# Patient Record
Sex: Female | Born: 1995 | Race: Black or African American | Hispanic: No | Marital: Single | Smoking: Never smoker
Health system: Southern US, Community
[De-identification: ages and names within clinical notes are randomized; demographics above are authoritative.]

---
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
## 2014-02-03 ENCOUNTER — Emergency Department (HOSPITAL_COMMUNITY)
Admission: EM | Admit: 2014-02-03 | Discharge: 2014-02-03 | Disposition: A | Discharge status: Home / Self Care | Payer: Medicaid - Out of State | Attending: Emergency Medicine | Admitting: Emergency Medicine

## 2014-02-03 ENCOUNTER — Encounter (HOSPITAL_COMMUNITY): Payer: Self-pay | Admitting: Emergency Medicine

## 2014-02-03 DIAGNOSIS — J069 Acute upper respiratory infection, unspecified: Secondary | ICD-10-CM | POA: Diagnosis not present

## 2014-02-03 DIAGNOSIS — R05 Cough: Secondary | ICD-10-CM | POA: Diagnosis present

## 2014-02-03 DIAGNOSIS — Z791 Long term (current) use of non-steroidal anti-inflammatories (NSAID): Secondary | ICD-10-CM | POA: Insufficient documentation

## 2014-02-03 MED ORDER — IBUPROFEN 400 MG PO TABS
400.0000 mg | ORAL_TABLET | Freq: Once | ORAL | Status: AC
  Administered 2014-02-03: 400 mg via ORAL
  Filled 2014-02-03: qty 1

## 2014-02-03 NOTE — ED Notes (Signed)
Pt reports that Monday she started feeling bad with cough/congestion and today she developed a headache. Pt sts she took 5 500mg  ibuprofen since this afternoon (sts she is sure it was 500mg  ibuprofen) pt has since then she has become dizzy.

## 2014-02-03 NOTE — ED Notes (Signed)
Patient is alert and orientedx4.  Patient was explained discharge instructions and they understood them with no questions.  The patient's friend, Raynald BlendBrittany Bowles is taking the patient home.

## 2014-02-03 NOTE — Discharge Instructions (Signed)
It was our pleasure to provide your ER care today - we hope that you feel better.  Rest. Drink plenty of fluids.  Take tylenol/motrin as need.  You may try over the counter cold/flu medication for symptom relief.  Follow up with primary care doctor in 1 week if symptoms fail to improve/resolve.  Return to ER if worse, new symptoms, trouble breathing, other concern.     Upper Respiratory Infection, Adult An upper respiratory infection (URI) is also sometimes known as the common cold. The upper respiratory tract includes the nose, sinuses, throat, trachea, and bronchi. Bronchi are the airways leading to the lungs. Most people improve within 1 week, but symptoms can last up to 2 weeks. A residual cough may last even longer.  CAUSES Many different viruses can infect the tissues lining the upper respiratory tract. The tissues become irritated and inflamed and often become very moist. Mucus production is also common. A cold is contagious. You can easily spread the virus to others by oral contact. This includes kissing, sharing a glass, coughing, or sneezing. Touching your mouth or nose and then touching a surface, which is then touched by another person, can also spread the virus. SYMPTOMS  Symptoms typically develop 1 to 3 days after you come in contact with a cold virus. Symptoms vary from person to person. They may include:  Runny nose.  Sneezing.  Nasal congestion.  Sinus irritation.  Sore throat.  Loss of voice (laryngitis).  Cough.  Fatigue.  Muscle aches.  Loss of appetite.  Headache.  Low-grade fever. DIAGNOSIS  You might diagnose your own cold based on familiar symptoms, since most people get a cold 2 to 3 times a year. Your caregiver can confirm this based on your exam. Most importantly, your caregiver can check that your symptoms are not due to another disease such as strep throat, sinusitis, pneumonia, asthma, or epiglottitis. Blood tests, throat tests, and X-rays  are not necessary to diagnose a common cold, but they may sometimes be helpful in excluding other more serious diseases. Your caregiver will decide if any further tests are required. RISKS AND COMPLICATIONS  You may be at risk for a more severe case of the common cold if you smoke cigarettes, have chronic heart disease (such as heart failure) or lung disease (such as asthma), or if you have a weakened immune system. The very young and very old are also at risk for more serious infections. Bacterial sinusitis, middle ear infections, and bacterial pneumonia can complicate the common cold. The common cold can worsen asthma and chronic obstructive pulmonary disease (COPD). Sometimes, these complications can require emergency medical care and may be life-threatening. PREVENTION  The best way to protect against getting a cold is to practice good hygiene. Avoid oral or hand contact with people with cold symptoms. Wash your hands often if contact occurs. There is no clear evidence that vitamin C, vitamin E, echinacea, or exercise reduces the chance of developing a cold. However, it is always recommended to get plenty of rest and practice good nutrition. TREATMENT  Treatment is directed at relieving symptoms. There is no cure. Antibiotics are not effective, because the infection is caused by a virus, not by bacteria. Treatment may include:  Increased fluid intake. Sports drinks offer valuable electrolytes, sugars, and fluids.  Breathing heated mist or steam (vaporizer or shower).  Eating chicken soup or other clear broths, and maintaining good nutrition.  Getting plenty of rest.  Using gargles or lozenges for comfort.  Controlling  fevers with ibuprofen or acetaminophen as directed by your caregiver.  Increasing usage of your inhaler if you have asthma. Zinc gel and zinc lozenges, taken in the first 24 hours of the common cold, can shorten the duration and lessen the severity of symptoms. Pain medicines  may help with fever, muscle aches, and throat pain. A variety of non-prescription medicines are available to treat congestion and runny nose. Your caregiver can make recommendations and may suggest nasal or lung inhalers for other symptoms.  HOME CARE INSTRUCTIONS   Only take over-the-counter or prescription medicines for pain, discomfort, or fever as directed by your caregiver.  Use a warm mist humidifier or inhale steam from a shower to increase air moisture. This may keep secretions moist and make it easier to breathe.  Drink enough water and fluids to keep your urine clear or pale yellow.  Rest as needed.  Return to work when your temperature has returned to normal or as your caregiver advises. You may need to stay home longer to avoid infecting others. You can also use a face mask and careful hand washing to prevent spread of the virus. SEEK MEDICAL CARE IF:   After the first few days, you feel you are getting worse rather than better.  You need your caregiver's advice about medicines to control symptoms.  You develop chills, worsening shortness of breath, or Mergen or red sputum. These may be signs of pneumonia.  You develop yellow or Delorey nasal discharge or pain in the face, especially when you bend forward. These may be signs of sinusitis.  You develop a fever, swollen neck glands, pain with swallowing, or white areas in the back of your throat. These may be signs of strep throat. SEEK IMMEDIATE MEDICAL CARE IF:   You have a fever.  You develop severe or persistent headache, ear pain, sinus pain, or chest pain.  You develop wheezing, a prolonged cough, cough up blood, or have a change in your usual mucus (if you have chronic lung disease).  You develop sore muscles or a stiff neck. Document Released: 06/13/2000 Document Revised: 03/12/2011 Document Reviewed: 03/25/2013 [facility] Patient Information 2015 [place], [place]. This information is not intended to replace advice given  to you by your health care provider. Make sure you discuss any questions you have with your health care provider.

## 2014-02-03 NOTE — ED Provider Notes (Signed)
CSN: [id number]     Arrival date & time 02/03/14  2148 History   First MD Initiated Contact with Patient 02/03/14 2225     Chief Complaint  Patient presents with  . Cough  . Headache     (Consider location/radiation/quality/duration/timing/severity/associated sxs/prior Treatment) Patient is a 19 y.o. female presenting with cough and headaches. The history is provided by the patient.  Cough Associated symptoms: headaches   Associated symptoms: no chest pain, no chills, no eye discharge, no rash and no shortness of breath   Headache Associated symptoms: cough   Associated symptoms: no abdominal pain, no back pain, no pain, no neck pain, no neck stiffness, no numbness and no sinus pressure   pt c/o non productive cough in the past few days. Scratchy throat, no trouble swallowing. Had gradual onset headache earlier, mild, now improved. No neck pain or stiffness. subj fever. No chills/sweats. Denies abd pain. No nvd. No dysuria or gu c/o. No cp or sob. No known ill contacts. Normally healthy, no hx chronic illness, non smoker.     History reviewed. No pertinent past medical history. History reviewed. No pertinent past surgical history. No family history on file. History  Substance Use Topics  . Smoking status: Never Smoker   . Smokeless tobacco: Not on file  . Alcohol Use: No   OB History    No data available     Review of Systems  Constitutional: Negative for chills.  HENT: Negative for sinus pressure and trouble swallowing.   Eyes: Negative for pain, discharge and redness.  Respiratory: Positive for cough. Negative for shortness of breath.   Cardiovascular: Negative for chest pain.  Gastrointestinal: Negative for abdominal pain.  Genitourinary: Negative for flank pain.  Musculoskeletal: Negative for back pain, neck pain and neck stiffness.  Skin: Negative for rash.  Neurological: Positive for headaches. Negative for weakness and numbness.      Allergies  Review of  patient's allergies indicates no known allergies.  Home Medications   Prior to Admission medications   Medication Sig Start Date End Date Taking? Authorizing Provider  ibuprofen (ADVIL,MOTRIN) 200 MG tablet Take 1,000 mg by mouth every 6 (six) hours as needed. Patient stated that she took five "500mg " Ibuprofen.   Yes Historical Provider, MD  medroxyPROGESTERone (DEPO-PROVERA) 150 MG/ML injection Inject 150 mg into the muscle every 3 (three) months.   Yes Historical Provider, MD  ibuprofen (ADVIL,MOTRIN) 800 MG tablet Take 1 tablet (800 mg total) by mouth 3 (three) times daily. 10/21/13   Courtney A Forcucci, PA-C   BP 121/72 mmHg  Pulse 78  Temp(Src) 98.1 F (36.7 C)  Resp 20  SpO2 100%  LMP 02/01/2014 (Approximate) Physical Exam  Constitutional: She appears well-developed and well-nourished. No distress.  HENT:  Nose: Nose normal.  Mouth/Throat: Oropharynx is clear and moist.  No sinus or temporal tenderness.   Eyes: Conjunctivae are normal. Pupils are equal, round, and reactive to light. No scleral icterus.  Neck: Neck supple. No tracheal deviation present.  No stiffness or rigidity  Cardiovascular: Normal rate, regular rhythm, normal heart sounds and intact distal pulses.  Exam reveals no gallop and no friction rub.   No murmur heard. Pulmonary/Chest: Effort normal and breath sounds normal. No respiratory distress.  Abdominal: Soft. Normal appearance and bowel sounds are normal. She exhibits no distension. There is no tenderness.  No hsm.   Genitourinary:  No cva tenderness  Musculoskeletal: She exhibits no edema or tenderness.  Lymphadenopathy:    She has  no cervical adenopathy.  Neurological: She is alert.  Skin: Skin is warm and dry. No rash noted.  Psychiatric: She has a normal mood and affect.  Nursing note and vitals reviewed.   ED Course  Procedures (including critical care time) Labs Review     MDM   Staff had placed iv, 500 cc ns.  Motrin po. Pt  tolerating po fluids.  Hx/exam felt most c/w viral uri.  Pt appears stable for d/c.      Suzi Roots, MD 02/03/14 2256

## 2014-02-10 ENCOUNTER — Emergency Department (HOSPITAL_COMMUNITY)
Admission: EM | Admit: 2014-02-10 | Discharge: 2014-02-10 | Disposition: A | Discharge status: Home / Self Care | Payer: Medicaid - Out of State | Attending: Emergency Medicine | Admitting: Emergency Medicine

## 2014-02-10 ENCOUNTER — Encounter (HOSPITAL_COMMUNITY): Payer: Self-pay | Admitting: Emergency Medicine

## 2014-02-10 DIAGNOSIS — H5711 Ocular pain, right eye: Secondary | ICD-10-CM

## 2014-02-10 DIAGNOSIS — Z79899 Other long term (current) drug therapy: Secondary | ICD-10-CM | POA: Diagnosis not present

## 2014-02-10 DIAGNOSIS — R51 Headache: Secondary | ICD-10-CM | POA: Insufficient documentation

## 2014-02-10 DIAGNOSIS — H6691 Otitis media, unspecified, right ear: Secondary | ICD-10-CM | POA: Diagnosis not present

## 2014-02-10 DIAGNOSIS — H9201 Otalgia, right ear: Secondary | ICD-10-CM | POA: Diagnosis present

## 2014-02-10 DIAGNOSIS — R519 Headache, unspecified: Secondary | ICD-10-CM

## 2014-02-10 MED ORDER — IBUPROFEN 800 MG PO TABS
800.0000 mg | ORAL_TABLET | Freq: Once | ORAL | Status: AC
  Administered 2014-02-10: 800 mg via ORAL
  Filled 2014-02-10: qty 1

## 2014-02-10 MED ORDER — FLUORESCEIN SODIUM 1 MG OP STRP
1.0000 | ORAL_STRIP | Freq: Once | OPHTHALMIC | Status: AC
  Administered 2014-02-10: 1 via OPHTHALMIC
  Filled 2014-02-10: qty 1

## 2014-02-10 MED ORDER — TETRACAINE HCL 0.5 % OP SOLN
2.0000 [drp] | Freq: Once | OPHTHALMIC | Status: AC
  Administered 2014-02-10: 2 [drp] via OPHTHALMIC
  Filled 2014-02-10: qty 2

## 2014-02-10 MED ORDER — CIPROFLOXACIN-DEXAMETHASONE 0.3-0.1 % OT SUSP
4.0000 [drp] | Freq: Once | OTIC | Status: AC
  Administered 2014-02-10: 4 [drp] via OTIC
  Filled 2014-02-10: qty 7.5

## 2014-02-10 MED ORDER — AMOXICILLIN 500 MG PO CAPS: 500.0000 mg | ORAL_CAPSULE | Freq: Three times a day (TID) | ORAL | Status: DC

## 2014-02-10 NOTE — ED Notes (Signed)
Per EMS pt comes from [employer] c/o rigth eye and right ear pain that started last night.  Pt took Tylenol last night for pain but hasnt taking anything today.  Pt also has cough with yellow congestion. Pt went to clinic at school but told EMS they looked in her ears and didn't do anything to help her so she called EMS.

## 2014-02-10 NOTE — ED Provider Notes (Signed)
CSN: [id number]     Arrival date & time 02/10/14  1402 History  This chart was scribed for non-physician practitioner, Louann SjogrenVictoria L Ikeem Cleckler, PA-C, working with No att. providers found, by Lionel DecemberHatice Demirci, ED Scribe. This patient was seen in room WTR9/WTR9 and the patient's care was started at 2:56 PM.   First MD Initiated Contact with Patient 02/10/14 1437     Chief Complaint  Patient presents with  . Eye Pain  . Otalgia  . Cough     (Consider location/radiation/quality/duration/timing/severity/associated sxs/prior Treatment) Patient is a 19 y.o. female presenting with eye pain, ear pain, and cough. The history is provided by the patient. No language interpreter was used.  Eye Pain  Otalgia Associated symptoms: cough   Associated symptoms: no congestion and no fever   Cough Associated symptoms: ear pain   Associated symptoms: no chills, no eye discharge and no fever     HPI Comments: Yolanda Henry is a 19 y.o. female who presents to the Emergency Department complaining of 9/10 eye pain/ear pain as well as a cough (no phlegm) since 7 pm onset 1 day ago. Patient also complains of a reoccurring intermittent headache.  Not severe or sudden in onset. Not worse of life. Did develop gradually. Patient does not wear contacts and has not taken any medication to alleviate her pain besides ear drops which was prescribed to her in 2014.  Patient denies any trauma to her eye and denies any discharge. No visual changes, slurred speech, weakness. Patient denies any fever/chills.    History reviewed. No pertinent past medical history. History reviewed. No pertinent past surgical history. No family history on file. History  Substance Use Topics  . Smoking status: Never Smoker   . Smokeless tobacco: Not on file  . Alcohol Use: No   OB History    No data available     Review of Systems  Constitutional: Negative for fever and chills.  HENT: Positive for ear pain. Negative for congestion.   Eyes:  Positive for pain. Negative for discharge and itching.  Respiratory: Positive for cough.       Allergies  Review of patient's allergies indicates no known allergies.  Home Medications   Prior to Admission medications   Medication Sig Start Date End Date Taking? Authorizing Provider  acetaminophen (TYLENOL) 500 MG tablet Take 1,000 mg by mouth every 6 (six) hours as needed for mild pain.   Yes Historical Provider, MD  carbamide peroxide (DEBROX) 6.5 % otic solution Place 5 drops into both ears 2 (two) times daily.   Yes Historical Provider, MD  medroxyPROGESTERone (DEPO-PROVERA) 150 MG/ML injection Inject 150 mg into the muscle every 3 (three) months.   Yes Historical Provider, MD  amoxicillin (AMOXIL) 500 MG capsule Take 1 capsule (500 mg total) by mouth 3 (three) times daily. 02/10/14   Louann SjogrenVictoria L Chastity Noland, PA-C  ibuprofen (ADVIL,MOTRIN) 200 MG tablet Take 1,000 mg by mouth every 6 (six) hours as needed. Patient stated that she took five "500mg " Ibuprofen.    Historical Provider, MD  ibuprofen (ADVIL,MOTRIN) 800 MG tablet Take 1 tablet (800 mg total) by mouth 3 (three) times daily. Patient not taking: Reported on 02/10/2014 10/21/13   Toni Amendourtney A Forcucci, PA-C   BP 112/56 mmHg  Pulse 85  Temp(Src) 98.5 F (36.9 C) (Oral)  Resp 20  SpO2 100%  LMP 02/01/2014 (Approximate) Physical Exam  Constitutional: She appears well-developed and well-nourished. No distress.  HENT:  Head: Normocephalic and atraumatic.  Right Ear: There is  tenderness. No drainage. No mastoid tenderness. Tympanic membrane is erythematous and retracted.  Left Ear: Tympanic membrane and external ear normal. No mastoid tenderness.  Nose: Right sinus exhibits no maxillary sinus tenderness and no frontal sinus tenderness. Left sinus exhibits no maxillary sinus tenderness and no frontal sinus tenderness.  Mouth/Throat: Mucous membranes are normal. Posterior oropharyngeal erythema present. No oropharyngeal exudate or  posterior oropharyngeal edema.  No facial rash or vesicular lesions.  Eyes: Conjunctivae and EOM are normal. Pupils are equal, round, and reactive to light. Lids are everted and swept, no foreign bodies found. Right conjunctiva is not injected. Right conjunctiva has no hemorrhage. Left conjunctiva is not injected. Left conjunctiva has no hemorrhage. Pupils are equal.  Visual acuity  Bilateral Distance: 20/20 ; R Distance: 20/25 ; L Distance: 20/20 Right intraocular pressure 22 Left intraocular pressure 22 No dendritic lesions corneal abrasion or ulcers. Patient with clear tears but no other discharge from eyes No mastoid tenderness.  Neck: Normal range of motion. Neck supple.  Cardiovascular: Normal rate, regular rhythm and normal heart sounds.   Pulmonary/Chest: Effort normal and breath sounds normal. No respiratory distress. She has no wheezes. She has no rales.  Abdominal: Soft. Bowel sounds are normal. She exhibits no distension. There is no tenderness.  Lymphadenopathy:    She has cervical adenopathy.  Neurological: She is alert.  Speech is clear and goal oriented. Peripheral visual fields intact. Strength 5/5 in upper and lower extremities. Sensation intact. No pronator drift. Normal gait.   Skin: Skin is warm and dry. She is not diaphoretic.  Nursing note and vitals reviewed.   ED Course  Procedures (including critical care time) DIAGNOSTIC STUDIES: Oxygen Saturation is 100% on RA, normal by my interpretation.    COORDINATION OF CARE: 3:15 PM Discussed treatment plan with patient at beside, the patient agrees with the plan and has no further questions at this time.  Labs Review Labs Reviewed - No data to display  Imaging Review No results found.   EKG Interpretation None        MDM   Final diagnoses:  Eye pain, right  Acute right otitis media, recurrence not specified, unspecified otitis media type  Acute nonintractable headache, unspecified headache type    Patient presents with otalgia and exam consistent with acute otitis media. No concern for acute mastoiditis, meningitis.  No antibiotic use in the last month.  Patient discharged home with Amoxicillin.  Patient with bilateral eye pain better with tetracaine. Visual acuity and intraocular pressure within normal limits. Patient without corneal abrasion, ulcer, dendritic lesions. Eye pain likely related to her headache. Patient's headache is without neurological symptoms. Normal neuro exam. I doubt intracranial hemorrhage, subarachnoid, meningitis. Patient is afebrile, nontoxic, and in no acute distress. Patient is appropriate for outpatient management and is stable for discharge. I have also discussed reasons to return immediately to the ER.  Parent expresses understanding and agrees with plan.  Discussed return precautions with patient. Discussed all results and patient verbalizes understanding and agrees with plan.  I personally performed the services described in this documentation, which was scribed in my presence. The recorded information has been reviewed and is accurate.    Louann Sjogren, PA-C 02/10/14 2034  Richardean Canal, MD 02/11/14 [phone]

## 2014-02-10 NOTE — Discharge Instructions (Signed)
Return to the emergency room with worsening of symptoms, new symptoms or with symptoms that are concerning, , especially fevers, stiff neck, worsening headache, nausea/vomiting, visual changes or slurred speech, chest pain, shortness of breath, cough with thick colored mucous or blood Drink plenty of fluids with electrolytes especially Gatorade. OTC cold medications such as mucinex, nyquil, dayquil are recommended. Chloraseptic for sore throat. Ibuprofen  (2 tablets ) every 5-6 hours for 3-5 days  Call the wellness center to establish care with a primary care provider and follow up. Or you may use the below resources to establish care. Ciprodex 4 drops in right ear twice daily for 7 days. Read below instructions and follow recommendations.  Otitis Media Otitis media is redness, soreness, and inflammation of the middle ear. Otitis media may be caused by allergies or, most commonly, by infection. Often it occurs as a complication of the common cold. SIGNS AND SYMPTOMS Symptoms of otitis media may include:  Earache.  Fever.  Ringing in your ear.  Headache.  Leakage of fluid from the ear. DIAGNOSIS To diagnose otitis media, your health care provider will examine your ear with an otoscope. This is an instrument that allows your health care provider to see into your ear in order to examine your eardrum. Your health care provider also will ask you questions about your symptoms. TREATMENT  Typically, otitis media resolves on its own within 3-5 days. Your health care provider may prescribe medicine to ease your symptoms of pain. If otitis media does not resolve within 5 days or is recurrent, your health care provider may prescribe antibiotic medicines if he or she suspects that a bacterial infection is the cause. HOME CARE INSTRUCTIONS   If you were prescribed an antibiotic medicine, finish it all even if you start to feel better.  Take medicines only as directed by your health care  provider.  Keep all follow-up visits as directed by your health care provider. SEEK MEDICAL CARE IF:  You have otitis media only in one ear, or bleeding from your nose, or both.  You notice a lump on your neck.  You are not getting better in 3-5 days.  You feel worse instead of better. SEEK IMMEDIATE MEDICAL CARE IF:   You have pain that is not controlled with medicine.  You have swelling, redness, or pain around your ear or stiffness in your neck.  You notice that part of your face is paralyzed.  You notice that the bone behind your ear (mastoid) is tender when you touch it. MAKE SURE YOU:   Understand these instructions.  Will watch your condition.  Will get help right away if you are not doing well or get worse. Document Released: 09/23/2003 Document Revised: 05/04/2013 Document Reviewed: 07/15/2012 [facility] Patient Information 2015 [place], [place]. This information is not intended to replace advice given to you by your health care provider. Make sure you discuss any questions you have with your health care provider.   Emergency Department Resource Guide 1) Find a Doctor and Pay Out of Pocket Although you won't have to find out who is covered by your insurance plan, it is a good idea to ask around and get recommendations. You will then need to call the office and see if the doctor you have chosen will accept you as a new patient and what types of options they offer for patients who are self-pay. Some doctors offer discounts or will set up payment plans for their patients who do not have insurance, but  you will need to ask so you aren't surprised when you get to your appointment.  2) Contact Your Local Health Department Not all health departments have doctors that can see patients for sick visits, but many do, so it is worth a call to see if yours does. If you don't know where your local health department is, you can check in your phone book. The CDC also has a tool to help you  locate your state's health department, and many state websites also have listings of all of their local health departments.  3) Find a Walk-in Clinic If your illness is not likely to be very severe or complicated, you may want to try a walk in clinic. These are popping up all over the country in pharmacies, drugstores, and shopping centers. They're usually staffed by nurse practitioners or physician assistants that have been trained to treat common illnesses and complaints. They're usually fairly quick and inexpensive. However, if you have serious medical issues or chronic medical problems, these are probably not your best option.  No Primary Care Doctor: - Call Health Connect at  [phone] - they can help you locate a primary care doctor that  accepts your insurance, provides certain services, etc. - Physician Referral Service- [phone]  Chronic Pain Problems: Organization         Address  Phone   Notes  [facility]  [phone] Patients need to be referred by their primary care doctor.   Medication Assistance: Organization         Address  Phone   Notes  [facility] [address] [place], [place] [address] [phone] --Must be a resident of [facility] [place] -- Must have NO insurance coverage whatsoever (no Medicaid/ Medicare, etc.) -- The pt. MUST have a primary care doctor that directs their care regularly and follows them in the community   MedAssist  [phone]   [employer]  [phone]    Agencies that provide inexpensive medical care: Organization         Address  Phone   Notes  [facility]  [phone]   [facility]    [phone]   [facility] [address] [place], [place] [address] [phone]) [phone]   [facility] [address], [place] [phone]   [facility]anned Parenthood    [phone]) [phone]   [facility]    [phone]   [facility]  [address], [place] Phone:  [phone], Fax:  [phone] Hours of Operation:  9 am - 6 pm, M-F.  Also accepts Medicaid/Medicare and self-pay.  [facility]  [address], [place] Phone: [phone]) [phone], Fax: [phone]. Hours of Operation:  8:30 am - 5:30 pm, M-F.  Also accepts Medicaid and self-pay.  [facility] [place] [address], [place] Phone: [phone]) [phone]   [facility] [address], [place], [place] [phone], Ext. [phone] Mondays & Thursdays: 7-9 AM.  First 15 patients are seen on a first come, first serve basis.    Medicaid-accepting [facility] Providers:  Organization         Address  Phone   Notes  [facility] [address],  [phone] Also accepts self-pay patients.  [facility] [address], [place]  [place]  [phone]   [place], [address], [place] [phone]   [place] [address], [place] [phone]   Lucianne Lei [address], [place]   [phone] Only accepts [place] Access [place] patients after they have their name applied to their card.   Self-Pay (no insurance) in [facility]:  Organization         Address  Phone   Notes  Sickle Cell Patients, [facility] [place] [phone]   [facility] [place] [phone]   [facility]  [place], [place], [place] [phone]   [facility]/Dr. Osei-Bonsu  [address], [place] or [address], [place] [phone] Phone number for both [place] and [place] locations is the same.  Urgent Medical and [facility] [address],  [place] [phone]   [facility] [address] [place] or [address] [phone]   [facility] [address] [place] [phone], phone; [phone], fax Sees patients 1st and 3rd Saturday of every month.  Must not qualify for public or private insurance (i.e. Medicaid, Medicare, [place] Health Choice, Veterans' Benefits)  Household income should be no more than 200% of the poverty level The clinic cannot treat you if you are pregnant or think you are pregnant  Sexually transmitted diseases are not treated at the clinic.    Dental Care: Organization         Address  Phone  Notes  [facility] Department of [facility] [place] [phone] Accepts children up to age 74 who are enrolled in [place] or [place]; pregnant women with a Medicaid card; and children who have applied for Medicaid or [place] Health Choice, but were declined, whose parents can pay a reduced fee at time of service.  [facility] [place] [facility] of [facility]  [address], [place] [phone] Accepts children up to age 51 who are enrolled in [place] or [place]; pregnant women with a Medicaid card; and children who have applied for Medicaid or [place] Health Choice, but were declined, whose parents can pay a reduced fee at time of service.  [place] Adult [facility] Access PROGRAM  [place] [phone] Patients are seen by appointment only. Walk-ins are not accepted. [place] will see patients 59 years of age and older. Monday - Tuesday (8am-5pm) Most Wednesdays (8:30-5pm) $30 per visit, cash only  [facility] Adult Dental Access PROGRAM  [address], [facility] [phone] Patients are seen by appointment only. Walk-ins are not accepted. [place] will see patients 61 years of age and older. One Wednesday Evening  (Monthly: Volunteer Based).  $30 per visit, cash only  [place]  [phone] for adults; Children under age 72, call Graduate Pediatric Dentistry at [phone]. Children aged 47-14, please call [phone] to request a pediatric application.  Dental services are provided in all areas of dental care including fillings, crowns and bridges, complete and partial dentures, implants, gum treatment, root canals, and extractions. Preventive care is also provided. Treatment is provided to both adults and children. Patients are selected via a lottery  and there is often a waiting list.   [facility] [address], [place]  [phone] [email or web address]   [facility] [address] [place], [place] [phone], Ext. [phone] Second and Fourth Thursday of each month, opens at 6:30 AM; Clinic ends at 9 AM.  Patients are seen on a first-come first-served basis, and a limited number are seen during each clinic.   [facility]  [address] Hillard Danker [place], [place] [phone]   Eligibility Requirements You must have lived in [place], [place], or [place] counties for at least the last three months.   You cannot be eligible for state or federal sponsored [employer], including [employer], [place], or [employer].   You generally cannot be eligible for healthcare insurance through your employer.    How to apply: Eligibility screenings are held every Tuesday and Wednesday afternoon from 1:00 pm until 4:00 pm. You do not need an appointment for the interview!  [facility] [address], [place], [place]   [place]  [place] Department  [place]  [phone]    Behavioral Health Resources in the Community: Intensive Outpatient Programs Organization         Address  Phone  Notes  Clay Baldwin. [address] [place], [place] [phone]   [facility] [address] [place], [place]   ADS: Alcohol & Drug Svcs [address], [place], [place]   [place] [address]  [place], [place] or [phone]   Substance Abuse Resources Organization         Address  Phone  Notes  Alcohol and Drug Services  [phone]   [place]  [phone]   [facility] [place]   Chinita Pester  [phone]   Residential & Outpatient Substance Abuse Program  [phone]   Psychological Services Organization         Address  Phone  Notes  [facility] [place]  [place]  [phone]   [place] [address], [place] or [phone]    Mobile Crisis Teams Organization         Address  Phone  Notes  [facility], Mobile Crisis Care Unit  [phone]   [facility]  [address] [place], [place]   [place] Levels [address] [place] [phone]    Self-Help/Support Groups Organization         Address  Phone             Notes  [place]. of [place] - variety of support groups  [place] Call for more information  Narcotics Anonymous (NA), [facility]aring [facility] [address], [employer] [place]  2 meetings at this location   Special educational needs teacher         Address  Phone  Notes  ASAP Residential Treatment [place],    [place]  [phone]   [facility]  [address] [place] [address]92, [place], [place]   [place], [place] [phone] Admissions: 8am-3pm M-F  Incentives Substance [place] [address]    [place], [place] [phone]   [facility] [address],  [place],  [place] [phone]   [facility] [address],  [place], [place] [phone]   [facility] - Intensive Outpatient [address], [place], [place] [phone]   [facility] ([facility] [address]  [address], [place] [phone] or [phone]   [facility] (RTS) [address], [place], [place] [phone] Accepts Medicaid  [facility] [place] [address]  [place] [phone] Substance Abuse/Addiction Treatment   [facility] Organization         Address  Phone  Notes  [facility]  [phone]   Angie Fava, PhD [address] Ervin Knack [place], [place]   [phone] or [phone]   [facility] Behavioral   [address] [place], [place] [phone]   [facility] [address] [place], [place] [phone] Insurance/Medicaid/sponsorship through [facility] and Families [address]                                    [place], [place] [phone] Therapy/tele-psych/case  [facility] [address], [place] [phone]    Dr. Lolly Mustache  [phone]   [facility] [place]  United Way [facility] Dept. 1) [address] [place] 2) [address] [place] 3)  [address], [place] [phone]  [phone]   [facility] Child Abuse Hotline 716-447-8463 or [phone] (After Hours)

## 2014-09-29 ENCOUNTER — Emergency Department (INDEPENDENT_AMBULATORY_CARE_PROVIDER_SITE_OTHER)
Admission: EM | Admit: 2014-09-29 | Discharge: 2014-09-29 | Disposition: A | Discharge status: Home / Self Care | Payer: Medicaid - Out of State | Source: Home / Self Care | Attending: Emergency Medicine | Admitting: Emergency Medicine

## 2014-09-29 ENCOUNTER — Encounter (HOSPITAL_COMMUNITY): Payer: Self-pay | Admitting: Emergency Medicine

## 2014-09-29 DIAGNOSIS — J018 Other acute sinusitis: Secondary | ICD-10-CM

## 2014-09-29 MED ORDER — TRAMADOL HCL 50 MG PO TABS: 50.0000 mg | ORAL_TABLET | Freq: Four times a day (QID) | ORAL | Status: AC | PRN

## 2014-09-29 MED ORDER — PREDNISONE 50 MG PO TABS: ORAL_TABLET | ORAL | Status: AC

## 2014-09-29 MED ORDER — AMOXICILLIN-POT CLAVULANATE 875-125 MG PO TABS: 1.0000 | ORAL_TABLET | Freq: Two times a day (BID) | ORAL | Status: DC

## 2014-09-29 MED ORDER — FLUTICASONE PROPIONATE 50 MCG/ACT NA SUSP: 2.0000 | Freq: Every day | NASAL | Status: AC

## 2014-09-29 NOTE — ED Provider Notes (Signed)
CSN: [id number]     Arrival date & time 09/29/14  1814 History   First MD Initiated Contact with Patient 09/29/14 1840     Chief Complaint  Patient presents with  . URI   (Consider location/radiation/quality/duration/timing/severity/associated sxs/prior Treatment) HPI Yolanda Henry is a 19 year old woman here for evaluation of sinus issues. Yolanda Henry states for the last 3 weeks Yolanda Henry has had nasal congestion and rhinorrhea. Her symptoms have gradually been worsening. In the last few days, Yolanda Henry has developed right sided maxillary and frontal pain area in Yolanda Henry states this pain will radiate to her right jaw and right ear. Yolanda Henry also reports a mild sore throat. Yolanda Henry also describes a cough. No fevers, but Yolanda Henry does report hot flashes and cold chills. No nausea or vomiting. Yolanda Henry has tried multiple over-the-counter medications without improvement.  History reviewed. No pertinent past medical history. History reviewed. No pertinent past surgical history. No family history on file. Social History  Substance Use Topics  . Smoking status: Never Smoker   . Smokeless tobacco: None  . Alcohol Use: No   OB History    No data available     Review of Systems As in history of present illness Allergies  Review of patient's allergies indicates no known allergies.  Home Medications   Prior to Admission medications   Medication Sig Start Date End Date Taking? Authorizing Denard Tuminello  acetaminophen (TYLENOL) 500 MG tablet Take 1,000 mg by mouth every 6 (six) hours as needed for mild pain.    Historical Esmee Fallaw, MD  amoxicillin-clavulanate (AUGMENTIN) 875-125 MG tablet Take 1 tablet by mouth 2 (two) times daily. 09/29/14   Charm Rings, MD  carbamide peroxide (DEBROX) 6.5 % otic solution Place 5 drops into both ears 2 (two) times daily.    Historical Keymani Mclean, MD  fluticasone (FLONASE) 50 MCG/ACT nasal spray Place 2 sprays into both nostrils daily. 09/29/14   Charm Rings, MD  ibuprofen (ADVIL,MOTRIN) 200 MG tablet Take 1,000 mg by  mouth every 6 (six) hours as needed. Patient stated that Yolanda Henry took five " " Ibuprofen.    Historical Desma Wilkowski, MD  ibuprofen (ADVIL,MOTRIN) 800 MG tablet Take 1 tablet (800 mg total) by mouth 3 (three) times daily. Patient not taking: Reported on 02/10/2014 10/21/13   Terri Piedra, PA-C  medroxyPROGESTERone (DEPO-PROVERA) 150 MG/ML injection Inject 150 mg into the muscle every 3 (three) months.    Historical Johnross Nabozny, MD  predniSONE (DELTASONE) 50 MG tablet Take 1 pill daily for 5 days. 09/29/14   Charm Rings, MD  traMADol (ULTRAM) 50 MG tablet Take 1 tablet (50 mg total) by mouth every 6 (six) hours as needed. 09/29/14   Charm Rings, MD   Meds Ordered and Administered this Visit  Medications - No data to display  BP 124/83 mmHg  Pulse 66  Temp(Src) 98.3 F (36.8 C) (Oral)  Resp 16  SpO2 96%  LMP 09/29/2014 No data found.   Physical Exam  Constitutional: Yolanda Henry is oriented to person, place, and time. Yolanda Henry appears well-developed and well-nourished. No distress.  HENT:  Mouth/Throat: No oropharyngeal exudate.  Nasal mucosa is erythematous and boggy. I lateral TMs are retracted. Yolanda Henry has tenderness to the right maxillary and right frontal sinuses. There is some postnasal drainage seen.  Neck: Neck supple.  Cardiovascular: Normal rate, regular rhythm and normal heart sounds.   No murmur heard. Pulmonary/Chest: Effort normal and breath sounds normal. No respiratory distress. Yolanda Henry has no wheezes. Yolanda Henry has no rales.  Lymphadenopathy:    Yolanda Henry  has no cervical adenopathy.  Neurological: Yolanda Henry is alert and oriented to person, place, and time.    ED Course  Procedures (including critical care time)  Labs Review Labs Reviewed - No data to display  Imaging Review No results found.    MDM   1. Other acute sinusitis    Treat with Augmentin and prednisone. Flonase for the next week. Tramadol as needed for pain. Follow-up if not improving in the next week.    Charm Rings,  MD 09/29/14 [phone]

## 2014-09-29 NOTE — ED Notes (Signed)
Patient has sinus congestion, blowing green from nose.  Pain right side of head, dental pain on the right, facial and ear pain.

## 2014-09-29 NOTE — Discharge Instructions (Signed)
You have a sinus infection. Take Augmentin and prednisone as prescribed. Flonase daily for the next week. Use the tramadol every 8 hours as needed for pain. Do not drive or taking this medicine. Follow-up if not improving by Monday or Tuesday.

## 2015-01-12 ENCOUNTER — Emergency Department (INDEPENDENT_AMBULATORY_CARE_PROVIDER_SITE_OTHER)
Admission: EM | Admit: 2015-01-12 | Discharge: 2015-01-12 | Disposition: A | Discharge status: Home / Self Care | Payer: Medicaid - Out of State | Source: Home / Self Care | Attending: Emergency Medicine | Admitting: Emergency Medicine

## 2015-01-12 ENCOUNTER — Encounter (HOSPITAL_COMMUNITY): Payer: Self-pay | Admitting: Emergency Medicine

## 2015-01-12 DIAGNOSIS — J018 Other acute sinusitis: Secondary | ICD-10-CM

## 2015-01-12 MED ORDER — IBUPROFEN 600 MG PO TABS: 600.0000 mg | ORAL_TABLET | Freq: Four times a day (QID) | ORAL | Status: AC | PRN

## 2015-01-12 MED ORDER — HYDROCODONE-HOMATROPINE 5-1.5 MG/5ML PO SYRP: 5.0000 mL | ORAL_SOLUTION | Freq: Four times a day (QID) | ORAL | Status: AC | PRN

## 2015-01-12 MED ORDER — AMOXICILLIN-POT CLAVULANATE 875-125 MG PO TABS: 1.0000 | ORAL_TABLET | Freq: Two times a day (BID) | ORAL | Status: AC

## 2015-01-12 NOTE — Discharge Instructions (Signed)
You have a sinus infection. Take Augmentin as prescribed. Make sure you take all of the pills. Take ibuprofen every 6 hours as needed for pain. Use the Hycodan at bedtime as needed for cough. Do not drive while taking this medicine. Follow-up as needed.

## 2015-01-12 NOTE — ED Notes (Signed)
[facility] with a complaint of right side otalgia and a cough. The patient stated that she was treated at the ED with amoxicillin for the ear infection but did not complete it. The patient stated that this has been ongoing for 3 weeks.

## 2015-01-12 NOTE — ED Provider Notes (Signed)
CSN: [id number]     Arrival date & time 01/12/15  1636 History   First MD Initiated Contact with Patient 01/12/15 1746     Chief Complaint  Patient presents with  . Otalgia  . Cough   (Consider location/radiation/quality/duration/timing/severity/associated sxs/prior Treatment) HPI  She is a 20 year old woman here for evaluation of cough and ear pain. She states she was seen in the emergency room about 3 weeks ago and diagnosed with a ear infection on the right. She states she was prescribed amoxicillin was getting better, but she did not finish all of the antibiotic. She also reports significant nasal congestion and rhinorrhea that started 2 weeks ago. Today, she reports some continued nasal congestion, pain in the right sinuses, pain in the right ear, and cough at night. The cough does keep her awake. She denies any fevers or shortness of breath.  History reviewed. No pertinent past medical history. History reviewed. No pertinent past surgical history. History reviewed. No pertinent family history. Social History  Substance Use Topics  . Smoking status: Never Smoker   . Smokeless tobacco: None  . Alcohol Use: No   OB History    No data available     Review of Systems As in history of present illness Allergies  Review of patient's allergies indicates no known allergies.  Home Medications   Prior to Admission medications   Medication Sig Start Date End Date Taking? Authorizing Provider  acetaminophen (TYLENOL) 500 MG tablet Take 1,000 mg by mouth every 6 (six) hours as needed for mild pain.    Historical Provider, MD  amoxicillin-clavulanate (AUGMENTIN) 875-125 MG tablet Take 1 tablet by mouth 2 (two) times daily. 01/12/15   Charm Rings, MD  carbamide peroxide (DEBROX) 6.5 % otic solution Place 5 drops into both ears 2 (two) times daily.    Historical Provider, MD  fluticasone (FLONASE) 50 MCG/ACT nasal spray Place 2 sprays into both nostrils daily. 09/29/14   Charm Rings, MD   HYDROcodone-homatropine (HYCODAN) 5-1.5 MG/5ML syrup Take 5 mLs by mouth every 6 (six) hours as needed for cough. 01/12/15   Charm Rings, MD  ibuprofen (ADVIL,MOTRIN) 600 MG tablet Take 1 tablet (600 mg total) by mouth every 6 (six) hours as needed for moderate pain. 01/12/15   Charm Rings, MD  medroxyPROGESTERone (DEPO-PROVERA) 150 MG/ML injection Inject 150 mg into the muscle every 3 (three) months.    Historical Provider, MD  predniSONE (DELTASONE) 50 MG tablet Take 1 pill daily for 5 days. 09/29/14   Charm Rings, MD  traMADol (ULTRAM) 50 MG tablet Take 1 tablet (50 mg total) by mouth every 6 (six) hours as needed. 09/29/14   Charm Rings, MD   Meds Ordered and Administered this Visit  Medications - No data to display  BP 114/57 mmHg  Pulse 83  Temp(Src) 98.8 F (37.1 C) (Oral)  Resp 18  SpO2 99%  LMP 01/12/2015 (Exact Date) No data found.   Physical Exam  Constitutional: She is oriented to person, place, and time. She appears well-developed and well-nourished. No distress.  HENT:  Mouth/Throat: No oropharyngeal exudate.  Nasal mucosa is erythematous and boggy. Oropharynx is clear. TMs normal bilaterally. She does have right maxillary and frontal sinus tenderness.  Neck: Neck supple.  Cardiovascular: Normal rate, regular rhythm and normal heart sounds.   No murmur heard. Pulmonary/Chest: Effort normal and breath sounds normal. No respiratory distress. She has no wheezes. She has no rales.  Lymphadenopathy:  She has no cervical adenopathy.  Neurological: She is alert and oriented to person, place, and time.    ED Course  Procedures (including critical care time)  Labs Review Labs Reviewed - No data to display  Imaging Review No results found.    MDM   1. Other acute sinusitis    Treat with Augmentin, ibuprofen, and Hycodan. Discussed importance of finishing the antibiotic. Follow-up as needed.    Charm RingsErin J Shawnte Winton, MD 01/12/15 [phone]

## 2015-04-29 ENCOUNTER — Emergency Department (HOSPITAL_COMMUNITY): Payer: Medicaid - Out of State

## 2015-04-29 ENCOUNTER — Emergency Department (HOSPITAL_COMMUNITY)
Admission: EM | Admit: 2015-04-29 | Discharge: 2015-04-29 | Disposition: A | Discharge status: Home / Self Care | Payer: Medicaid - Out of State | Attending: Emergency Medicine | Admitting: Emergency Medicine

## 2015-04-29 ENCOUNTER — Encounter (HOSPITAL_COMMUNITY): Payer: Self-pay | Admitting: Emergency Medicine

## 2015-04-29 DIAGNOSIS — Y92410 Unspecified street and highway as the place of occurrence of the external cause: Secondary | ICD-10-CM | POA: Insufficient documentation

## 2015-04-29 DIAGNOSIS — M62838 Other muscle spasm: Secondary | ICD-10-CM | POA: Insufficient documentation

## 2015-04-29 DIAGNOSIS — S199XXA Unspecified injury of neck, initial encounter: Secondary | ICD-10-CM | POA: Diagnosis not present

## 2015-04-29 DIAGNOSIS — M419 Scoliosis, unspecified: Secondary | ICD-10-CM | POA: Diagnosis not present

## 2015-04-29 DIAGNOSIS — Z79899 Other long term (current) drug therapy: Secondary | ICD-10-CM | POA: Insufficient documentation

## 2015-04-29 DIAGNOSIS — Y998 Other external cause status: Secondary | ICD-10-CM | POA: Diagnosis not present

## 2015-04-29 DIAGNOSIS — Z792 Long term (current) use of antibiotics: Secondary | ICD-10-CM | POA: Diagnosis not present

## 2015-04-29 DIAGNOSIS — Z7952 Long term (current) use of systemic steroids: Secondary | ICD-10-CM | POA: Insufficient documentation

## 2015-04-29 DIAGNOSIS — S3992XA Unspecified injury of lower back, initial encounter: Secondary | ICD-10-CM | POA: Insufficient documentation

## 2015-04-29 DIAGNOSIS — M62830 Muscle spasm of back: Secondary | ICD-10-CM | POA: Diagnosis not present

## 2015-04-29 DIAGNOSIS — M545 Low back pain, unspecified: Secondary | ICD-10-CM

## 2015-04-29 DIAGNOSIS — Y9389 Activity, other specified: Secondary | ICD-10-CM | POA: Insufficient documentation

## 2015-04-29 MED ORDER — NAPROXEN 250 MG PO TABS
500.0000 mg | ORAL_TABLET | Freq: Once | ORAL | Status: AC
  Administered 2015-04-29: 500 mg via ORAL
  Filled 2015-04-29: qty 2

## 2015-04-29 MED ORDER — NAPROXEN 500 MG PO TABS: 500.0000 mg | ORAL_TABLET | Freq: Two times a day (BID) | ORAL | Status: AC | PRN

## 2015-04-29 MED ORDER — HYDROCODONE-ACETAMINOPHEN 5-325 MG PO TABS: 1.0000 | ORAL_TABLET | Freq: Four times a day (QID) | ORAL | Status: AC | PRN

## 2015-04-29 MED ORDER — CYCLOBENZAPRINE HCL 10 MG PO TABS: 10.0000 mg | ORAL_TABLET | Freq: Three times a day (TID) | ORAL | Status: AC | PRN
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
## 2016-09-01 ENCOUNTER — Encounter (HOSPITAL_COMMUNITY): Payer: Self-pay | Admitting: *Deleted

## 2016-09-01 ENCOUNTER — Ambulatory Visit (HOSPITAL_COMMUNITY)
Admission: EM | Admit: 2016-09-01 | Discharge: 2016-09-01 | Disposition: A | Discharge status: Home / Self Care | Payer: Medicaid - Out of State | Attending: Radiology | Admitting: Radiology

## 2016-09-01 DIAGNOSIS — R21 Rash and other nonspecific skin eruption: Secondary | ICD-10-CM | POA: Diagnosis not present

## 2016-09-01 MED ORDER — HYDROCORTISONE 1 % EX CREA: TOPICAL_CREAM | CUTANEOUS | 0 refills | Status: AC

## 2016-09-01 MED ORDER — DIPHENHYDRAMINE HCL 25 MG PO TABS: 25.0000 mg | ORAL_TABLET | Freq: Four times a day (QID) | ORAL | 0 refills | Status: AC

## 2016-09-01 NOTE — Discharge Instructions (Signed)
Patch test an area prior to applying it to your face

## 2016-09-01 NOTE — ED Triage Notes (Signed)
C/O pruritic, bumpy, "burning" rash to face x 3 days.

## 2016-09-01 NOTE — ED Provider Notes (Signed)
[facility]    CSN: [id number] Arrival date & time: 09/01/16  1315     History   Chief Complaint Chief Complaint  Patient presents with  . Rash    HPI Yolanda Henry is a 21 y.o. female.   21 y.o. female presents with rash to face with uticara since Wednesday evening. Patient states that she washed her face with a huggies wipe prior to rash appearing X. Condition is acute in nature. Condition is made better by nothong. Condition is made worse by nothing. Patient denies any relief from benadryl prior to there arrival at this facility.        History reviewed. No pertinent past medical history.  There are no active problems to display for this patient.   History reviewed. No pertinent surgical history.  OB History    No data available       Home Medications    Prior to Admission medications   Medication Sig Start Date End Date Taking? Authorizing Provider  acetaminophen (TYLENOL) 500 MG tablet Take 1,000 mg by mouth every 6 (six) hours as needed for mild pain.    [provider]  amoxicillin-clavulanate (AUGMENTIN) 875-125 MG tablet Take 1 tablet by mouth 2 (two) times daily. 01/12/15   Charm Rings, MD  carbamide peroxide (DEBROX) 6.5 % otic solution Place 5 drops into both ears 2 (two) times daily.    [provider]  cyclobenzaprine (FLEXERIL) 10 MG tablet Take 1 tablet (10 mg total) by mouth 3 (three) times daily as needed for muscle spasms. 04/29/15   Street, Mercedes, PA-C  fluticasone (FLONASE) 50 MCG/ACT nasal spray Place 2 sprays into both nostrils daily. 09/29/14   Charm Rings, MD  HYDROcodone-acetaminophen (NORCO) 5-325 MG tablet Take 1 tablet by mouth every 6 (six) hours as needed for severe pain. 04/29/15   Street, Mercedes, PA-C  HYDROcodone-homatropine (HYCODAN) 5-1.5 MG/5ML syrup Take 5 mLs by mouth every 6 (six) hours as needed for cough. 01/12/15   Charm Rings, MD  ibuprofen (ADVIL,MOTRIN) 600 MG tablet Take 1 tablet (600 mg  total) by mouth every 6 (six) hours as needed for moderate pain. 01/12/15   Charm Rings, MD  medroxyPROGESTERone (DEPO-PROVERA) 150 MG/ML injection Inject 150 mg into the muscle every 3 (three) months.    [provider]  naproxen (NAPROSYN) 500 MG tablet Take 1 tablet (500 mg total) by mouth 2 (two) times daily as needed for mild pain, moderate pain or headache (TAKE WITH MEALS.). 04/29/15   Street, Mercedes, PA-C  predniSONE (DELTASONE) 50 MG tablet Take 1 pill daily for 5 days. 09/29/14   Charm Rings, MD  traMADol (ULTRAM) 50 MG tablet Take 1 tablet (50 mg total) by mouth every 6 (six) hours as needed. 09/29/14   Charm Rings, MD    Family History No family history on file.  Social History Social History  Substance Use Topics  . Smoking status: Never Smoker  . Smokeless tobacco: Not on file  . Alcohol use Yes     Comment: occasionally     Allergies   Patient has no known allergies.   Review of Systems Review of Systems  Constitutional: Negative for chills and fever.  HENT: Negative for ear pain and sore throat.   Eyes: Negative for pain and visual disturbance.  Respiratory: Negative for cough and shortness of breath.   Cardiovascular: Negative for chest pain and palpitations.  Gastrointestinal: Negative for abdominal pain and vomiting.  Genitourinary: Negative  for dysuria and hematuria.  Musculoskeletal: Negative for arthralgias and back pain.  Skin: Positive for rash ( rash to face). Negative for color change.  Neurological: Negative for seizures and syncope.  All other systems reviewed and are negative.    Physical Exam Triage Vital Signs ED Triage Vitals [09/01/16 1431]  Enc Vitals Group     BP 108/68     Pulse Rate 70     Resp 16     Temp 98.7 F (37.1 C)     Temp Source Oral     SpO2 100 %     Weight      Height      Head Circumference      Peak Flow      Pain Score 6     Pain Loc      Pain Edu?      Excl. in GC?    No data  found.   Updated Vital Signs BP 108/68   Pulse 70   Temp 98.7 F (37.1 C) (Oral)   Resp 16   LMP 08/31/2016 (Exact Date)   SpO2 100%   Visual Acuity Right Eye Distance:   Left Eye Distance:   Bilateral Distance:    Right Eye Near:   Left Eye Near:    Bilateral Near:     Physical Exam  Constitutional: She appears well-developed and well-nourished. No distress.  HENT:  Head: Normocephalic and atraumatic.  Eyes: Conjunctivae are normal.  Neck: Neck supple.  Cardiovascular: Normal rate and regular rhythm.   No murmur heard. Pulmonary/Chest: Effort normal and breath sounds normal. No respiratory distress.  Abdominal: Soft. There is no tenderness.  Musculoskeletal: She exhibits no edema.  Neurological: She is alert.  Skin: Skin is warm and dry. Rash ( contact dermatitis to face) noted.  Psychiatric: She has a normal mood and affect.  Nursing note and vitals reviewed.    UC Treatments / Results  Labs (all labs ordered are listed, but only abnormal results are displayed) Labs Reviewed - No data to display  EKG  EKG Interpretation None       Radiology No results found.  Procedures Procedures (including critical care time)  Medications Ordered in UC Medications - No data to display   Initial Impression / Assessment and Plan / UC Course  I have reviewed the triage vital signs and the nursing notes.  Pertinent labs & imaging results that were available during my care of the patient were reviewed by me and considered in my medical decision making (see chart for details).       Final Clinical Impressions(s) / UC Diagnoses   Final diagnoses:  None    New Prescriptions New Prescriptions   No medications on file     Controlled Substance Prescriptions [place] Controlled Substance Registry consulted? No   Alene MiresOmohundro, Jennifer C, NP 09/01/16 1520

## 2016-10-04 ENCOUNTER — Ambulatory Visit (HOSPITAL_COMMUNITY)
Admission: EM | Admit: 2016-10-04 | Discharge: 2016-10-04 | Disposition: A | Discharge status: Home / Self Care | Payer: Medicaid - Out of State | Attending: Emergency Medicine | Admitting: Emergency Medicine

## 2016-10-04 ENCOUNTER — Encounter (HOSPITAL_COMMUNITY): Payer: Self-pay | Admitting: Family Medicine

## 2016-10-04 DIAGNOSIS — A5602 Chlamydial vulvovaginitis: Secondary | ICD-10-CM | POA: Insufficient documentation

## 2016-10-04 DIAGNOSIS — B9689 Other specified bacterial agents as the cause of diseases classified elsewhere: Secondary | ICD-10-CM | POA: Insufficient documentation

## 2016-10-04 DIAGNOSIS — Z113 Encounter for screening for infections with a predominantly sexual mode of transmission: Secondary | ICD-10-CM

## 2016-10-04 DIAGNOSIS — Z202 Contact with and (suspected) exposure to infections with a predominantly sexual mode of transmission: Secondary | ICD-10-CM

## 2016-10-04 DIAGNOSIS — N760 Acute vaginitis: Secondary | ICD-10-CM | POA: Insufficient documentation
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
## 2016-10-04 NOTE — ED Triage Notes (Signed)
Pt here for STD check. Denies symptoms but states that her boyfriend has been having symptoms.

## 2016-10-04 NOTE — Discharge Instructions (Signed)
The results will be back in 36-48 hours. He will be called for any positive results. Recommend using the "my chart "so that she can go online and check your own results. You may obtaining the results sooner than the actual call. If there are any positives we will likely be able to manage this over the telephone with prescriptions unless you have gonorrhea, in that case she will have to return for injection. You can also get checked for STDs at the [facility] Department for free.

## 2016-10-05 ENCOUNTER — Telehealth (HOSPITAL_COMMUNITY): Payer: Self-pay | Admitting: Internal Medicine

## 2016-10-05 LAB — CERVICOVAGINAL ANCILLARY ONLY
Bacterial vaginitis: POSITIVE — AB
Candida vaginitis: NEGATIVE
Chlamydia: POSITIVE — AB
Neisseria Gonorrhea: NEGATIVE
Trichomonas: NEGATIVE

## 2016-10-05 NOTE — Telephone Encounter (Signed)
Clinical staff, please let patient and health department know that test for chlamydia was positive.  Please verify patient's pharmacy preference, currently listed pharmacy is in [place].  Patient needs rx for zithromax 1g po x 1, no refills, as soon as possible.   Please refrain from sexual intercourse for 7 days to give the medicine time to work.  Sexual partners need to be notified and tested/treated.  Condoms may reduce risk of reinfection.   Please let patient know that test for gardnerella (bacterial vaginosis) was positive.  This only needs to be treated if there are symptoms, such as vaginal irritation/discharge.  If these symptoms are present, ok to send rx for metronidazole  bid x 7d #14 no refills or metronidazole vaginal gel 0.75% 1 applicatorful bid x 7d #14 no refills.   Recheck for further evaluation if symptoms are not improving.   LM

## 2016-10-09 ENCOUNTER — Ambulatory Visit (HOSPITAL_COMMUNITY)
Admission: EM | Admit: 2016-10-09 | Discharge: 2016-10-09 | Discharge status: Against Medical Advice | Payer: No Typology Code available for payment source

## 2016-10-09 ENCOUNTER — Telehealth (HOSPITAL_COMMUNITY): Payer: Self-pay | Admitting: Emergency Medicine

## 2016-10-09 MED ORDER — AZITHROMYCIN 250 MG PO TABS: 1000.0000 mg | ORAL_TABLET | Freq: Once | ORAL | 0 refills | Status: AC

## 2016-10-09 MED ORDER — METRONIDAZOLE 500 MG PO TABS: 500.0000 mg | ORAL_TABLET | Freq: Two times a day (BID) | ORAL | 0 refills | Status: AC

## 2016-10-09 NOTE — Telephone Encounter (Signed)
-----   Message from Eustace Moore, MD sent at 10/05/2016 10:01 PM EDT ----- Clinical staff, please let patient and health department know that test for chlamydia was positive.  Please verify patient's pharmacy preference, currently listed pharmacy is in [place].  Patient needs rx for zithromax 1g po x 1, no refills, as soon as possible.   Please refrain from sexual intercourse for 7 days to give the medicine time to work.  Sexual partners need to be notified and tested/treated.  Condoms may reduce risk of reinfection.   Please let patient know that test for gardnerella (bacterial vaginosis) was positive.  This only needs to be treated if there are symptoms, such as vaginal irritation/discharge.  If these symptoms are present, ok to send rx for metronidazole  bid x 7d #14 no refills or metronidazole vaginal gel 0.75% 1 applicatorful bid x 7d #14 no refills.   Recheck for further evaluation if symptoms are not improving.   LM

## 2016-10-09 NOTE — Telephone Encounter (Signed)
Pt came in to get lab results.... Pt ID'd properly Reports feeling better and sx are subsiding Pt requests meds to be sent to [facility] [employer]) Sent Flagyl and Azithromycin per her request.  Adv pt if sx are not getting better to return or to f/u w/PCP Education on safe sex given Also adv pt to notify partner(s) Notified pt that lab results can be obtained through MyChart Pt verb understanding.
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
